# Patient Record
Sex: Female | Born: 1965 | Race: White | Hispanic: No | Marital: Married | Smoking: Current some day smoker
Health system: Southern US, Community
[De-identification: ages and names within clinical notes are randomized; demographics above are authoritative.]

## PROBLEM LIST (undated history)

## (undated) DIAGNOSIS — B009 Herpesviral infection, unspecified: Secondary | ICD-10-CM

## (undated) DIAGNOSIS — F419 Anxiety disorder, unspecified: Secondary | ICD-10-CM

## (undated) DIAGNOSIS — I10 Essential (primary) hypertension: Secondary | ICD-10-CM

## (undated) DIAGNOSIS — T7840XA Allergy, unspecified, initial encounter: Secondary | ICD-10-CM

## (undated) DIAGNOSIS — K219 Gastro-esophageal reflux disease without esophagitis: Secondary | ICD-10-CM

## (undated) DIAGNOSIS — E781 Pure hyperglyceridemia: Secondary | ICD-10-CM

## (undated) HISTORY — PX: BREAST SURGERY: SHX581

## (undated) HISTORY — PX: OTHER SURGICAL HISTORY: SHX169

## (undated) HISTORY — DX: Essential (primary) hypertension: I10

## (undated) HISTORY — PX: BREAST ENHANCEMENT SURGERY: SHX7

## (undated) HISTORY — PX: EYE SURGERY: SHX253

## (undated) HISTORY — DX: Allergy, unspecified, initial encounter: T78.40XA

## (undated) HISTORY — DX: Gastro-esophageal reflux disease without esophagitis: K21.9

## (undated) HISTORY — PX: WISDOM TOOTH EXTRACTION: SHX21

## (undated) HISTORY — DX: Pure hyperglyceridemia: E78.1

## (undated) HISTORY — DX: Herpesviral infection, unspecified: B00.9

## (undated) HISTORY — DX: Anxiety disorder, unspecified: F41.9

---

## 2004-05-14 ENCOUNTER — Emergency Department: Payer: Self-pay | Admitting: Unknown Physician Specialty

## 2015-01-01 ENCOUNTER — Emergency Department: Payer: BLUE CROSS/BLUE SHIELD

## 2015-01-01 ENCOUNTER — Emergency Department
Admission: EM | Admit: 2015-01-01 | Discharge: 2015-01-01 | Disposition: A | Discharge status: Home / Self Care | Payer: BLUE CROSS/BLUE SHIELD | Attending: Emergency Medicine | Admitting: Emergency Medicine

## 2015-01-01 ENCOUNTER — Encounter: Payer: Self-pay | Admitting: Emergency Medicine

## 2015-01-01 DIAGNOSIS — R110 Nausea: Secondary | ICD-10-CM | POA: Diagnosis present

## 2015-01-01 DIAGNOSIS — Z7951 Long term (current) use of inhaled steroids: Secondary | ICD-10-CM | POA: Diagnosis not present

## 2015-01-01 DIAGNOSIS — F17200 Nicotine dependence, unspecified, uncomplicated: Secondary | ICD-10-CM | POA: Insufficient documentation

## 2015-01-01 DIAGNOSIS — Z3202 Encounter for pregnancy test, result negative: Secondary | ICD-10-CM | POA: Diagnosis not present

## 2015-01-01 DIAGNOSIS — R531 Weakness: Secondary | ICD-10-CM | POA: Diagnosis not present

## 2015-01-01 DIAGNOSIS — R000 Tachycardia, unspecified: Secondary | ICD-10-CM

## 2015-01-01 DIAGNOSIS — Z79899 Other long term (current) drug therapy: Secondary | ICD-10-CM | POA: Diagnosis not present

## 2015-01-01 DIAGNOSIS — R002 Palpitations: Secondary | ICD-10-CM | POA: Diagnosis not present

## 2015-01-01 DIAGNOSIS — R55 Syncope and collapse: Secondary | ICD-10-CM | POA: Diagnosis not present

## 2015-01-01 DIAGNOSIS — R0602 Shortness of breath: Secondary | ICD-10-CM | POA: Diagnosis not present

## 2015-01-01 LAB — URINALYSIS COMPLETE WITH MICROSCOPIC (ARMC ONLY)
BILIRUBIN URINE: NEGATIVE
GLUCOSE, UA: NEGATIVE mg/dL
Hgb urine dipstick: NEGATIVE
Leukocytes, UA: NEGATIVE
Nitrite: NEGATIVE
PROTEIN: NEGATIVE mg/dL
Specific Gravity, Urine: 1.008 (ref 1.005–1.030)
pH: 5 (ref 5.0–8.0)

## 2015-01-01 LAB — CBC WITH DIFFERENTIAL/PLATELET
Basophils Absolute: 0 10*3/uL (ref 0–0.1)
Basophils Relative: 0 %
EOS ABS: 0 10*3/uL (ref 0–0.7)
EOS PCT: 0 %
HCT: 41 % (ref 35.0–47.0)
Hemoglobin: 13.4 g/dL (ref 12.0–16.0)
LYMPHS ABS: 1.4 10*3/uL (ref 1.0–3.6)
Lymphocytes Relative: 15 %
MCH: 29.6 pg (ref 26.0–34.0)
MCHC: 32.7 g/dL (ref 32.0–36.0)
MCV: 90.5 fL (ref 80.0–100.0)
MONO ABS: 0.5 10*3/uL (ref 0.2–0.9)
MONOS PCT: 6 %
Neutro Abs: 7.4 10*3/uL — ABNORMAL HIGH (ref 1.4–6.5)
Neutrophils Relative %: 79 %
PLATELETS: 224 10*3/uL (ref 150–440)
RBC: 4.53 MIL/uL (ref 3.80–5.20)
RDW: 13.3 % (ref 11.5–14.5)
WBC: 9.4 10*3/uL (ref 3.6–11.0)

## 2015-01-01 LAB — COMPREHENSIVE METABOLIC PANEL
ALT: 13 U/L — AB (ref 14–54)
AST: 14 U/L — AB (ref 15–41)
Albumin: 4.2 g/dL (ref 3.5–5.0)
Alkaline Phosphatase: 62 U/L (ref 38–126)
Anion gap: 6 (ref 5–15)
BUN: 17 mg/dL (ref 6–20)
CHLORIDE: 107 mmol/L (ref 101–111)
CO2: 23 mmol/L (ref 22–32)
CREATININE: 0.61 mg/dL (ref 0.44–1.00)
Calcium: 9 mg/dL (ref 8.9–10.3)
GFR calc Af Amer: >60 mL/min (ref >60)
GLUCOSE: 102 mg/dL — AB (ref 65–99)
Potassium: 3.6 mmol/L (ref 3.5–5.1)
Sodium: 136 mmol/L (ref 135–145)
Total Bilirubin: 0.6 mg/dL (ref 0.3–1.2)
Total Protein: 7.6 g/dL (ref 6.5–8.1)

## 2015-01-01 LAB — POCT PREGNANCY, URINE: Preg Test, Ur: NEGATIVE

## 2015-01-01 LAB — FIBRIN DERIVATIVES D-DIMER (ARMC ONLY): Fibrin derivatives D-dimer (ARMC): 572 — ABNORMAL HIGH (ref 0–499)

## 2015-01-01 LAB — TROPONIN I

## 2015-01-01 MED ORDER — IOHEXOL 350 MG/ML SOLN
100.0000 mL | Freq: Once | INTRAVENOUS | Status: AC | PRN
  Administered 2015-01-01: 85 mL via INTRAVENOUS

## 2015-01-01 MED ORDER — LORAZEPAM 1 MG PO TABS
1.0000 mg | ORAL_TABLET | Freq: Once | ORAL | Status: AC
  Administered 2015-01-01: 1 mg via ORAL
  Filled 2015-01-01: qty 1

## 2015-01-01 MED ORDER — SODIUM CHLORIDE 0.9 % IV BOLUS (SEPSIS)
1000.0000 mL | Freq: Once | INTRAVENOUS | Status: AC
  Administered 2015-01-01: 1000 mL via INTRAVENOUS

## 2015-01-01 NOTE — ED Notes (Signed)
[place] and having an episode where she felt like her heart rate was high, felt dizzy, nauseous as if she was going to pass out. Pt reports similar episode about 3 days ago; pt took half a xanax and felt relief. Pt reports hx of anxiety.  Pt A/O x 4, vitals WDL, no immediate distress at this time.  Pt husband at bedside.

## 2015-01-01 NOTE — Discharge Instructions (Signed)
It is unclear what led to your near syncopal episode and tachycardia. Your blood tests, EKG, and CT scan all is good. Your heart rate improved with IV fluids. Follow-up at [facility] clinic for ongoing care. Return to the emergency department if you have further episodes of weakness, or fainting, or if you have chest pain, shortness of breath, or other urgent concerns.  Near-Syncope Near-syncope (commonly known as near fainting) is sudden weakness, dizziness, or feeling like you might pass out. This can happen when getting up or while standing for a long time. It is caused by a sudden decrease in blood flow to the brain, which can occur for various reasons. Most of the reasons are not serious.  HOME CARE Watch your condition for any changes.  Have someone stay with you until you feel stable.  If you feel like you are going to pass out:  Lie down right away.  Prop your feet up if you can.  Breathe deeply and steadily.  Move only when the feeling has gone away. Most of the time, this feeling lasts only a few minutes. You may feel tired for several hours.  Drink enough fluids to keep your pee (urine) clear or pale yellow.  If you are taking blood pressure or heart medicine, stand up slowly.  Follow up with your doctor as told. GET HELP RIGHT AWAY IF:   You have a severe headache.  You have unusual pain in the chest, belly (abdomen), or back.  You have bleeding from the mouth or butt (rectum), or you have black or tarry poop (stool).  You feel your heart beat differently than normal, or you have a very fast pulse.  You pass out, or you twitch and shake when you pass out.  You pass out when sitting or lying down.  You feel confused.  You have trouble walking.  You are weak.  You have vision problems. MAKE SURE YOU:   Understand these instructions.  Will watch your condition.  Will get help right away if you are not doing well or get worse.   This information is not  intended to replace advice given to you by your health care provider. Make sure you discuss any questions you have with your health care provider.   Document Released: 06/25/2007 Document Revised: 01/27/2014 Document Reviewed: 06/11/2012 Elsevier Interactive Patient Education [employer].

## 2015-01-01 NOTE — ED Notes (Signed)
Reports an episode pta where her heart started beating fast and felt nauseated and dizzy.

## 2015-01-01 NOTE — ED Provider Notes (Signed)
[facility] Emergency Department Provider Note  ____________________________________________  Time seen: 1600  I have reviewed the triage vital signs and the nursing notes.  History by: Patient  HISTORY  Chief Complaint Nausea  palpitations, anxiety    HPI Colleen Duffy is a 49 y.o. female who was at the post office [place] when she began to feel weak, as though she was went to pass out, and she could feel that her heart rate was up. She felt somewhat nauseous. Patient reports that these feelings were only slightly like when she had had anxiety attacks before. She reports she has not had an anxiety attack in a few years.  The patient denies any chest pain, but she did have some mild shortness of breath. She denies any swelling in her legs. She denies a history of any blood clots, but she does report that it runs in her family. With further discussion, the family history was rather vague, and more consistent with coronary artery disease then DVT or pulmonary emboli.   History reviewed. No pertinent past medical history.  There are no active problems to display for this patient.   Past Surgical History  Procedure Laterality Date  . Breast surgery      Current Outpatient Rx  Name  Route  Sig  Dispense  Refill  . ALPRAZolam (XANAX) 0.5 MG tablet   Oral   Take 0.25-0.5 mg by mouth daily as needed for anxiety.         Marland Kitchen buPROPion (WELLBUTRIN XL) 150 MG 24 hr tablet   Oral   Take 150 mg by mouth daily.         . fluticasone (FLONASE) 50 MCG/ACT nasal spray   Each Nare   Place 1 spray into both nostrils 2 (two) times daily.         . pseudoephedrine (SUDAFED) 30 MG tablet   Oral   Take 30 mg by mouth every 4 (four) hours as needed for congestion (and/or sinus headache).           Allergies Sulfa antibiotics  History reviewed. No pertinent family history.  Social History Social History  Substance Use Topics  . Smoking status:  Current Every Day Smoker  . Smokeless tobacco: None  . Alcohol Use: None    Review of Systems  Constitutional: Negative for fever/chills. ENT: Negative for congestion. Cardiovascular: Other for palpitations and tachycardia. Near-syncope today. See history of present illness. Respiratory: Other for mild shortness of breath today. Gastrointestinal: Negative for abdominal pain, vomiting and diarrhea. Genitourinary: Negative for dysuria. Musculoskeletal: No back pain. Skin: Negative for rash. Neurological: Negative for headache or focal weakness   10-point ROS otherwise negative.  ____________________________________________   PHYSICAL EXAM:  VITAL SIGNS: ED Triage Vitals  Enc Vitals Group     BP 01/01/15 1329 152/83 mmHg     Pulse Rate 01/01/15 1533 103     Resp 01/01/15 1329 18     Temp 01/01/15 1329 98.6 F (37 C)     Temp Source 01/01/15 1329 Oral     SpO2 01/01/15 1533 99 %     Weight 01/01/15 1329 130 lb (58.968 kg)     Height 01/01/15 1329 5' (1.524 m)     Head Cir --      Peak Flow --      Pain Score --      Pain Loc --      Pain Edu? --      Excl. in [place]? --  Constitutional: Alert and oriented. Well appearing and in no distress. ENT   Head: Normocephalic and atraumatic.   Nose: No congestion/rhinnorhea.  Cardiovascular: Tachycardic at a rate of 113, regular rhythm, no murmur noted Respiratory:  Normal respiratory effort, no tachypnea.    Breath sounds are clear and equal bilaterally.  Gastrointestinal: Soft, no distention. Nontender Back: No muscle spasm, no tenderness, no CVA tenderness. Musculoskeletal: No deformity noted. Nontender with normal range of motion in all extremities.  No noted edema. Neurologic:  Communicative. Normal appearing spontaneous movement in all 4 extremities. No gross focal neurologic deficits are appreciated.  Skin:  Skin is warm, dry. No rash noted. Psychiatric: Mood and affect are normal. Speech and behavior are  normal.  ____________________________________________    LABS (pertinent positives/negatives)  Labs Reviewed  COMPREHENSIVE METABOLIC PANEL - Abnormal; Notable for the following:    Glucose, Bld 102 (*)    AST 14 (*)    ALT 13 (*)    All other components within normal limits  CBC WITH DIFFERENTIAL/PLATELET - Abnormal; Notable for the following:    Neutro Abs 7.4 (*)    All other components within normal limits  FIBRIN DERIVATIVES D-DIMER ([facility] ONLY) - Abnormal; Notable for the following:    Fibrin derivatives D-dimer ([facility]) 572 (*)    All other components within normal limits  URINALYSIS COMPLETEWITH MICROSCOPIC ([facility] ONLY) - Abnormal; Notable for the following:    Color, Urine STRAW (*)    APPearance CLEAR (*)    Ketones, ur 1+ (*)    Bacteria, UA RARE (*)    Squamous Epithelial / LPF 0-5 (*)    All other components within normal limits  TROPONIN I  POCT PREGNANCY, URINE     ____________________________________________   EKG  ED ECG REPORT I, Thurlow Gallaga W, the attending physician, personally viewed and interpreted this ECG.   Date: 01/01/2015  EKG Time: 1333  Rate: 102  Rhythm: Sinus tachycardia  Axis: Normal  Intervals: Normal  ST&T Change: Flat T in 3 with small Q   ____________________________________________    RADIOLOGY  CT angiogram chest:  IMPRESSION: No evidence of significant pulmonary embolus. No evidence of active pulmonary disease.   ____________________________________________   PROCEDURES    ____________________________________________   INITIAL IMPRESSION / ASSESSMENT AND PLAN / ED COURSE  Pertinent labs & imaging results that were available during my care of the patient were reviewed by me and considered in my medical decision making (see chart for details).  Pleasant, overall well-appearing 49 year old female. She does appear to be a little bit anxious, but overall appropriate and communicative. She is tachycardic at  105-125, with notable increase during parts of our conversation that may be more stress inducing.  My overall impression is that this is not an anxiety attack, but rather an underlying organic problem. It is vague and unclear what the underlying condition is.  The patient could use some fluids due to the tachycardia, however she would prefer not to have an IV. She is not having any nausea currently. She is currently drinking a bottle of water with my approval.  Her blood test overall looked good. We have discussed the possibility of a pulmonary emboli. Given her clinical symptoms, I overall have a low suspicion. She has no unilateral swelling, no significant shortness of breath, no significant chest pain or pleuritic pain. She has a vague family history of blood clots. We will check a d-dimer.  We will also check urinalysis.   ----------------------------------------- 6:40 PM on 01/01/2015 -----------------------------------------  Patient's d-dimer was positive at 572. I discussed this with the patient we've agreed to get a CT scan to rule out pulmonary emboli due to her unexplained tachycardia.  Patient does appear to be more calm now status post oral Ativan. Her heart rate is lower than it was, though still goes up just above 100 during our conversation.  ----------------------------------------- 7:44 PM on 01/01/2015 -----------------------------------------  CT angiogram is negative for pulmonary emboli. Results of urinalysis and reviewed. They are negative without sign of urinary tract infection.  At this time, the patient's heart rate is 102. She looks well and is communicative. It is unclear to me what has triggered the tachycardia. We have discussed her differential diagnosis at length. I've asked her to follow-up with [facility] clinic. I've also counseled her to return the emergency department if she has any further symptoms, weakness, or other urgent concerns.    ____________________________________________   FINAL CLINICAL IMPRESSION(S) / ED DIAGNOSES  Final diagnoses:  Near syncope  Sinus tachycardia ([place])       Ahmed Prima, MD 01/01/15 2013

## 2015-01-01 NOTE — ED Notes (Signed)
POCT Urine Preg negative.

## 2015-01-01 NOTE — ED Notes (Signed)
Patient transported to CT 

## 2017-04-16 IMAGING — CT CT ANGIO CHEST
2 of 6 series · 19 of 46 positions shown · IV contrast (APPLIED)
Comparison: None.

CLINICAL DATA: Tachycardia, near syncope, positive D-dimer. Patient
fell today episode of dizziness, nausea, and felt like she was going
to pass out. Similar episode 3 days ago.

EXAM:
CT ANGIOGRAPHY CHEST WITH CONTRAST
TECHNIQUE: Multidetector CT imaging of the chest was performed using the
standard protocol during bolus administration of intravenous
contrast. Multiplanar CT image reconstructions and MIPs were
obtained to evaluate the vascular anatomy.
CONTRAST:  85mL OMNIPAQUE IOHEXOL 350 MG/ML SOLN

[Series 5: pe thins 1.5 · axial · 0.68mm/px · z∈[-619,-384]mm · 16 of 220 slices shown]
[im 12/220  lung]
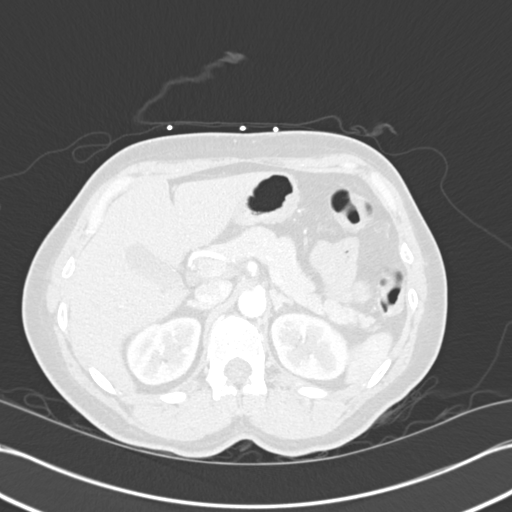
[im 24/220  soft-tissue]
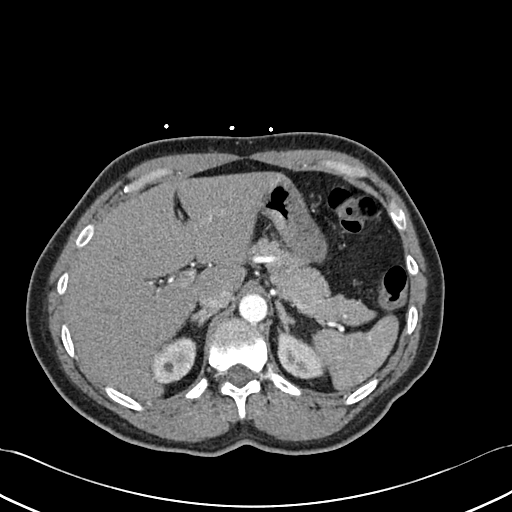
[im 35/220  lung]
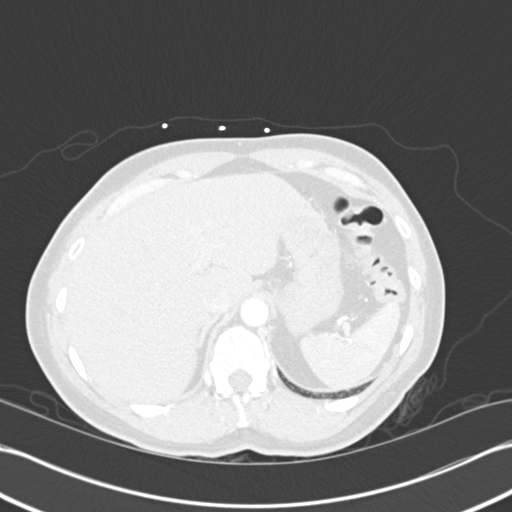
[im 47/220  soft-tissue]
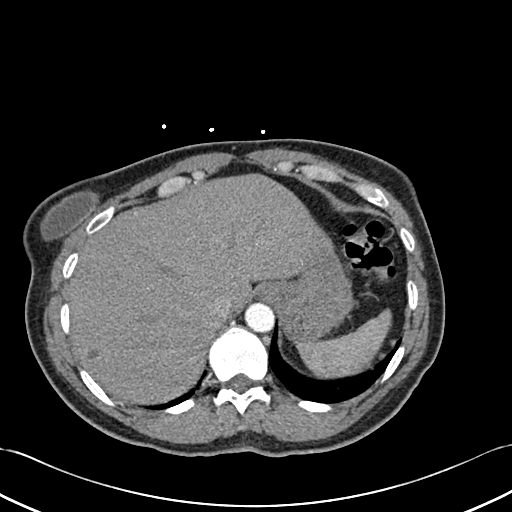
[im 70/220  lung]
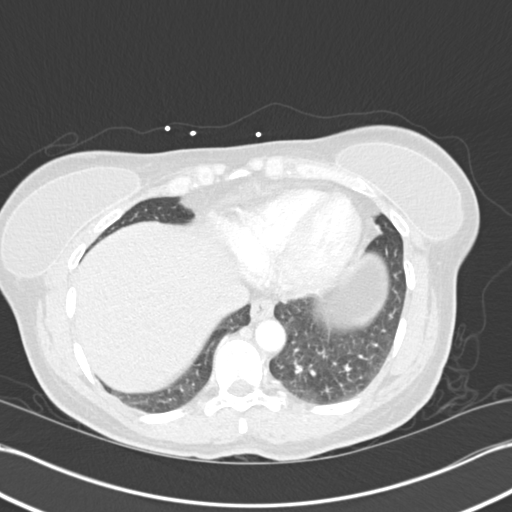
[im 81/220  soft-tissue]
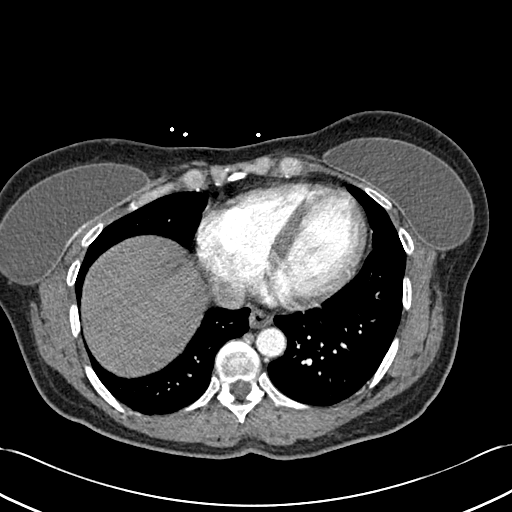
[im 93/220  lung]
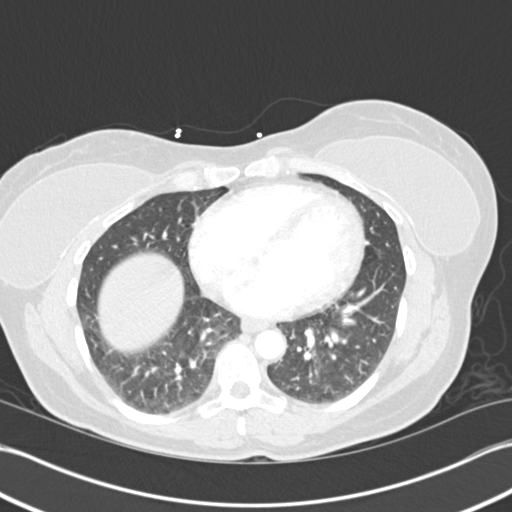
[im 104/220  soft-tissue]
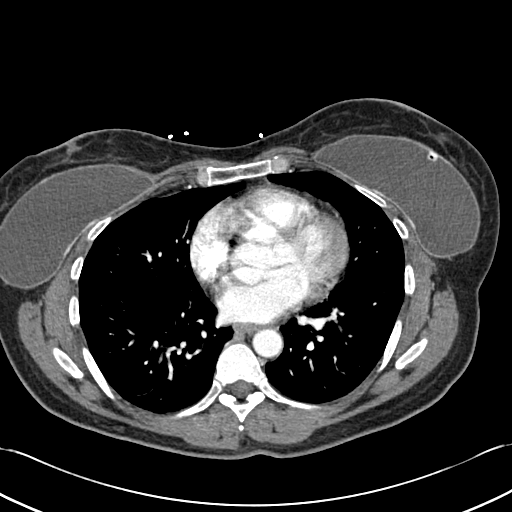
[im 116/220  lung]
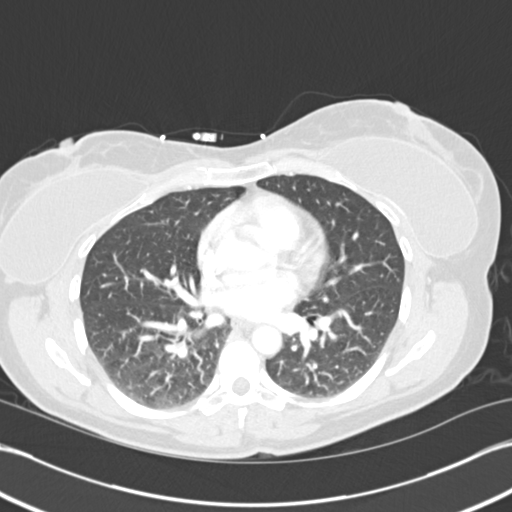
[im 127/220  soft-tissue]
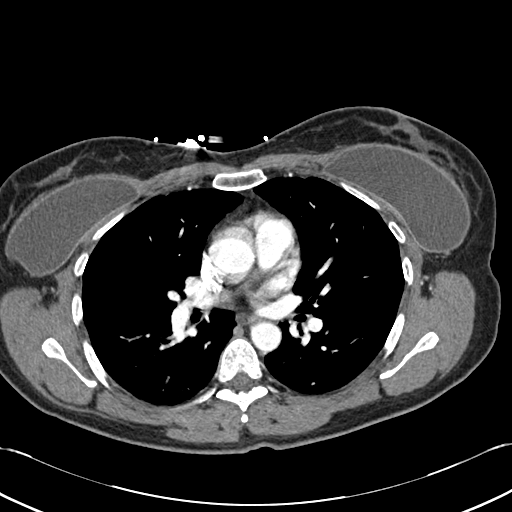
[im 139/220  lung]
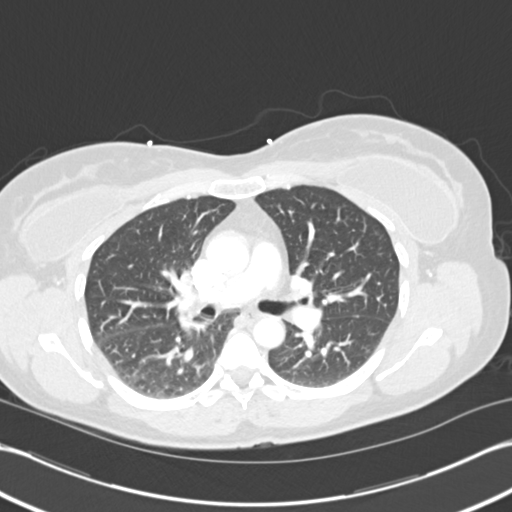
[im 150/220  soft-tissue]
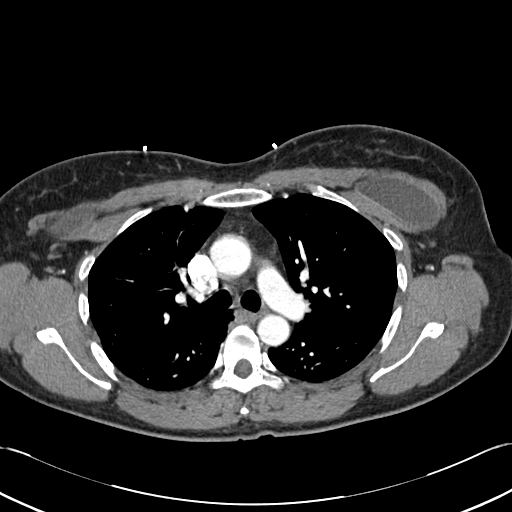
[im 173/220  lung]
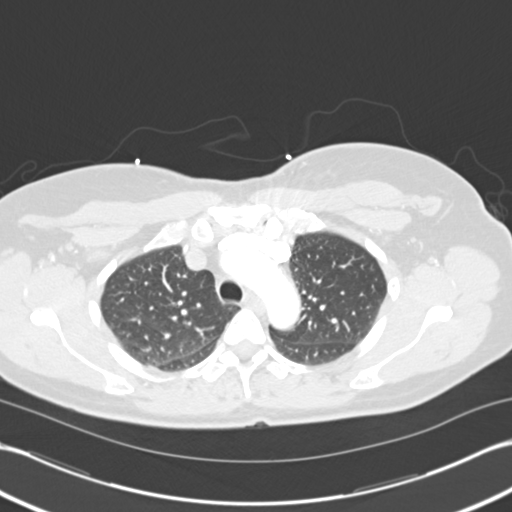
[im 185/220  soft-tissue]
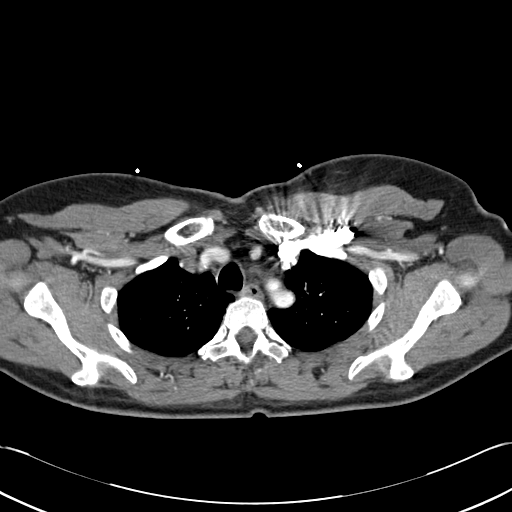
[im 196/220  lung]
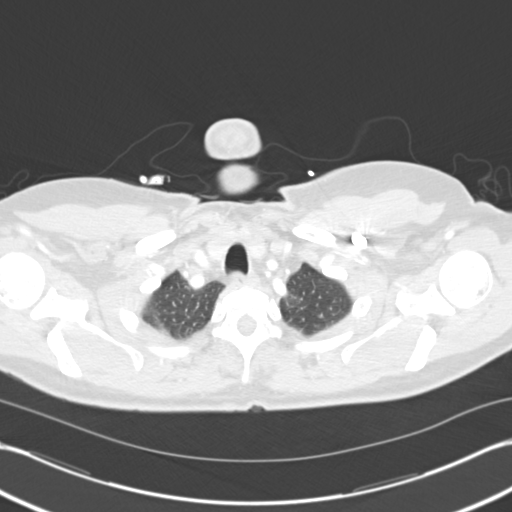
[im 208/220  soft-tissue]
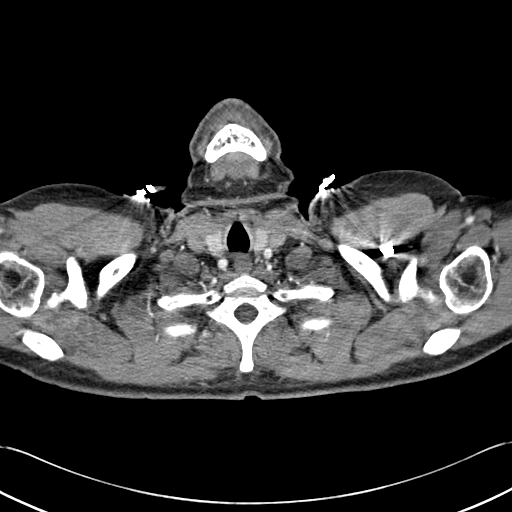

[Series 7: cor mpr 2.0 · coronal · 0.50mm/px · 3 of 109 slices shown]
[im 28/109  soft-tissue]
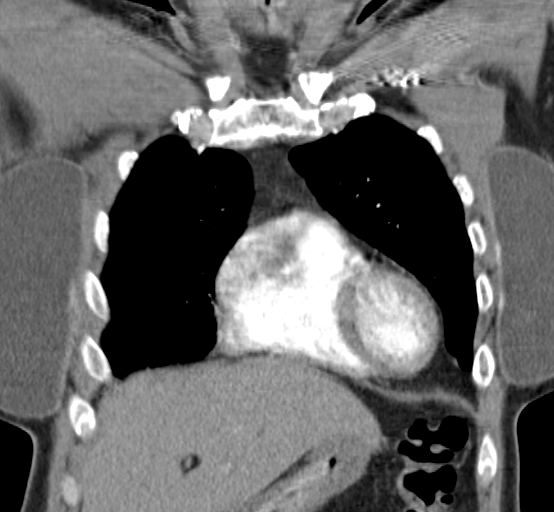
[im 55/109  soft-tissue]
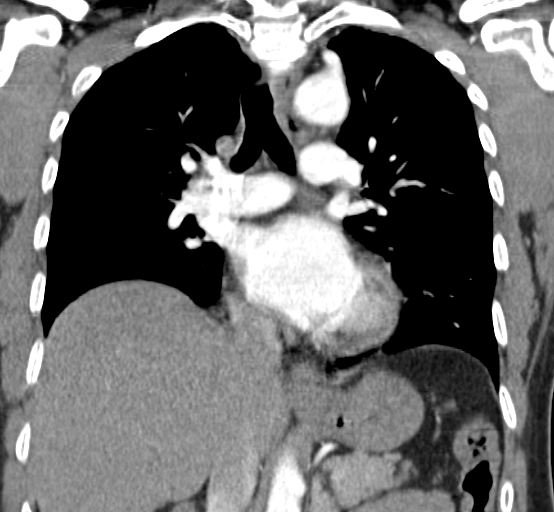
[im 82/109  soft-tissue]
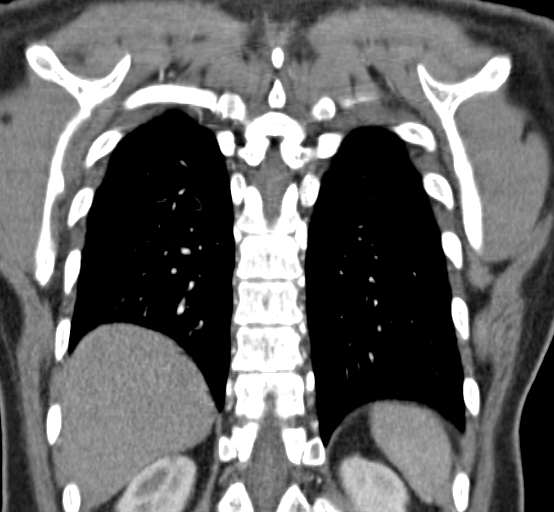

[19 of 46 positions shown; findings below may reference images not displayed]

FINDINGS: Technically adequate study with good opacification of the central
and segmental pulmonary arteries. No focal filling defects
identified. No evidence of significant pulmonary embolus.

Normal heart size. Normal caliber thoracic aorta. No evidence of
aortic dissection. Great vessel origins are patent. Esophagus is
mostly decompressed. No significant lymphadenopathy in the chest.

Evaluation of the lungs is limited due to respiratory motion
artifact. There is probable dependent atelectasis in the lung bases.
No focal consolidation or airspace disease is suggested. Airways are
patent. No pleural effusions. No pneumothorax.

Included portions of the upper abdominal organs demonstrate a sub cm
low-attenuation lesion in the right lobe of the liver. This is too
small to characterize but probably represents a small cyst or
hemangioma. Subcentimeter cysts are suggested in the upper poles of
both kidneys. Bilateral breast implants. Degenerative changes in the
spine. No destructive bone lesions appreciated.

Review of the MIP images confirms the above findings.
IMPRESSION: No evidence of significant pulmonary embolus. No evidence of active
pulmonary disease.

## 2018-03-18 ENCOUNTER — Other Ambulatory Visit: Payer: Self-pay

## 2018-03-18 DIAGNOSIS — I83891 Varicose veins of right lower extremities with other complications: Secondary | ICD-10-CM

## 2018-03-23 ENCOUNTER — Inpatient Hospital Stay (HOSPITAL_COMMUNITY): Admission: RE | Admit: 2018-03-23 | Payer: BLUE CROSS/BLUE SHIELD | Source: Ambulatory Visit

## 2018-03-23 ENCOUNTER — Encounter: Payer: Self-pay | Admitting: Family

## 2018-03-23 ENCOUNTER — Encounter: Payer: BLUE CROSS/BLUE SHIELD | Admitting: Vascular Surgery

## 2019-09-22 DIAGNOSIS — Z01419 Encounter for gynecological examination (general) (routine) without abnormal findings: Secondary | ICD-10-CM | POA: Diagnosis not present

## 2019-09-22 DIAGNOSIS — B373 Candidiasis of vulva and vagina: Secondary | ICD-10-CM | POA: Diagnosis not present

## 2019-09-22 DIAGNOSIS — Z13820 Encounter for screening for osteoporosis: Secondary | ICD-10-CM | POA: Diagnosis not present

## 2019-09-22 DIAGNOSIS — Z6828 Body mass index (BMI) 28.0-28.9, adult: Secondary | ICD-10-CM | POA: Diagnosis not present

## 2019-11-15 DIAGNOSIS — R5382 Chronic fatigue, unspecified: Secondary | ICD-10-CM | POA: Diagnosis not present

## 2019-11-15 DIAGNOSIS — N951 Menopausal and female climacteric states: Secondary | ICD-10-CM | POA: Diagnosis not present

## 2019-11-17 DIAGNOSIS — R6882 Decreased libido: Secondary | ICD-10-CM | POA: Diagnosis not present

## 2019-11-17 DIAGNOSIS — N951 Menopausal and female climacteric states: Secondary | ICD-10-CM | POA: Diagnosis not present

## 2019-11-17 DIAGNOSIS — Z6827 Body mass index (BMI) 27.0-27.9, adult: Secondary | ICD-10-CM | POA: Diagnosis not present

## 2019-12-20 DIAGNOSIS — J0110 Acute frontal sinusitis, unspecified: Secondary | ICD-10-CM | POA: Diagnosis not present

## 2019-12-20 DIAGNOSIS — I10 Essential (primary) hypertension: Secondary | ICD-10-CM | POA: Diagnosis not present

## 2020-02-08 DIAGNOSIS — R053 Chronic cough: Secondary | ICD-10-CM | POA: Diagnosis not present

## 2020-02-08 DIAGNOSIS — J0100 Acute maxillary sinusitis, unspecified: Secondary | ICD-10-CM | POA: Diagnosis not present

## 2020-02-14 DIAGNOSIS — N951 Menopausal and female climacteric states: Secondary | ICD-10-CM | POA: Diagnosis not present

## 2020-02-14 DIAGNOSIS — R5382 Chronic fatigue, unspecified: Secondary | ICD-10-CM | POA: Diagnosis not present

## 2020-02-16 DIAGNOSIS — R232 Flushing: Secondary | ICD-10-CM | POA: Diagnosis not present

## 2020-02-16 DIAGNOSIS — N951 Menopausal and female climacteric states: Secondary | ICD-10-CM | POA: Diagnosis not present

## 2020-02-16 DIAGNOSIS — N898 Other specified noninflammatory disorders of vagina: Secondary | ICD-10-CM | POA: Diagnosis not present

## 2020-04-10 DIAGNOSIS — Z0000 Encounter for general adult medical examination without abnormal findings: Secondary | ICD-10-CM | POA: Diagnosis not present

## 2020-04-10 DIAGNOSIS — Z13220 Encounter for screening for lipoid disorders: Secondary | ICD-10-CM | POA: Diagnosis not present

## 2020-04-10 DIAGNOSIS — Z131 Encounter for screening for diabetes mellitus: Secondary | ICD-10-CM | POA: Diagnosis not present

## 2020-04-17 DIAGNOSIS — Z1211 Encounter for screening for malignant neoplasm of colon: Secondary | ICD-10-CM | POA: Diagnosis not present

## 2020-04-17 DIAGNOSIS — E781 Pure hyperglyceridemia: Secondary | ICD-10-CM | POA: Diagnosis not present

## 2020-04-17 DIAGNOSIS — Z0000 Encounter for general adult medical examination without abnormal findings: Secondary | ICD-10-CM | POA: Diagnosis not present

## 2020-05-14 DIAGNOSIS — R5382 Chronic fatigue, unspecified: Secondary | ICD-10-CM | POA: Diagnosis not present

## 2020-05-14 DIAGNOSIS — N951 Menopausal and female climacteric states: Secondary | ICD-10-CM | POA: Diagnosis not present

## 2020-05-16 DIAGNOSIS — R6882 Decreased libido: Secondary | ICD-10-CM | POA: Diagnosis not present

## 2020-05-16 DIAGNOSIS — R232 Flushing: Secondary | ICD-10-CM | POA: Diagnosis not present

## 2020-05-16 DIAGNOSIS — N951 Menopausal and female climacteric states: Secondary | ICD-10-CM | POA: Diagnosis not present

## 2020-08-13 DIAGNOSIS — R5382 Chronic fatigue, unspecified: Secondary | ICD-10-CM | POA: Diagnosis not present

## 2020-08-13 DIAGNOSIS — N951 Menopausal and female climacteric states: Secondary | ICD-10-CM | POA: Diagnosis not present

## 2020-08-15 DIAGNOSIS — R6882 Decreased libido: Secondary | ICD-10-CM | POA: Diagnosis not present

## 2020-08-15 DIAGNOSIS — L814 Other melanin hyperpigmentation: Secondary | ICD-10-CM | POA: Diagnosis not present

## 2020-08-15 DIAGNOSIS — D2362 Other benign neoplasm of skin of left upper limb, including shoulder: Secondary | ICD-10-CM | POA: Diagnosis not present

## 2020-08-15 DIAGNOSIS — D485 Neoplasm of uncertain behavior of skin: Secondary | ICD-10-CM | POA: Diagnosis not present

## 2020-08-15 DIAGNOSIS — N951 Menopausal and female climacteric states: Secondary | ICD-10-CM | POA: Diagnosis not present

## 2020-08-15 DIAGNOSIS — R232 Flushing: Secondary | ICD-10-CM | POA: Diagnosis not present

## 2020-08-15 DIAGNOSIS — L821 Other seborrheic keratosis: Secondary | ICD-10-CM | POA: Diagnosis not present

## 2020-08-15 DIAGNOSIS — L819 Disorder of pigmentation, unspecified: Secondary | ICD-10-CM | POA: Diagnosis not present

## 2020-08-15 DIAGNOSIS — D2372 Other benign neoplasm of skin of left lower limb, including hip: Secondary | ICD-10-CM | POA: Diagnosis not present

## 2020-08-15 DIAGNOSIS — N898 Other specified noninflammatory disorders of vagina: Secondary | ICD-10-CM | POA: Diagnosis not present

## 2020-08-23 ENCOUNTER — Encounter: Payer: Self-pay | Admitting: Gastroenterology

## 2020-08-23 ENCOUNTER — Ambulatory Visit: Payer: BC Managed Care – PPO | Admitting: Gastroenterology

## 2020-08-23 ENCOUNTER — Other Ambulatory Visit (INDEPENDENT_AMBULATORY_CARE_PROVIDER_SITE_OTHER): Payer: BC Managed Care – PPO

## 2020-08-23 VITALS — BP 111/70 | HR 86 | Ht 60.0 in | Wt 162.0 lb

## 2020-08-23 DIAGNOSIS — R1010 Upper abdominal pain, unspecified: Secondary | ICD-10-CM

## 2020-08-23 DIAGNOSIS — R140 Abdominal distension (gaseous): Secondary | ICD-10-CM

## 2020-08-23 DIAGNOSIS — K5900 Constipation, unspecified: Secondary | ICD-10-CM

## 2020-08-23 DIAGNOSIS — Z8371 Family history of colonic polyps: Secondary | ICD-10-CM

## 2020-08-23 LAB — LIPASE: Lipase: 62 U/L — ABNORMAL HIGH (ref 11.0–59.0)

## 2020-08-23 MED ORDER — SUCRALFATE 1 G PO TABS: 1.0000 g | ORAL_TABLET | Freq: Three times a day (TID) | ORAL | 2 refills | Status: DC

## 2020-08-23 NOTE — Patient Instructions (Signed)
Your provider has requested that you go to the basement level for lab work before leaving today. Press "B" on the elevator. The lab is located at the first door on the left as you exit the elevator.  We have sent the following medications to your pharmacy for you to pick up at your convenience: carafate.   You have been scheduled for an endoscopy and colonoscopy. Please follow the written instructions given to you at your visit today. Please pick up your prep supplies at the pharmacy within the next 1-3 days. If you use inhalers (even only as needed), please bring them with you on the day of your procedure.  You will be contacted by [facility] in the next 2 days to arrange a complete abdominal ultrasound.  The number on your caller ID will be [phone], please answer when they call.  If you have not heard from them in 2 days please call [phone] to schedule.     Normal BMI (Body Mass Index- based on height and weight) is between 19 and 25. Your BMI today is Body mass index is 31.64 kg/m. Marland Kitchen Please consider follow up  regarding your BMI with your Primary Care Provider.  The  GI providers would like to encourage you to use [facility] to communicate with providers for non-urgent requests or questions.  Due to long hold times on the telephone, sending your provider a message by [facility] may be a faster and more efficient way to get a response.  Please allow 48 business hours for a response.  Please remember that this is for non-urgent requests.   Due to recent changes in healthcare laws, you may see the results of your imaging and laboratory studies on MyChart before your provider has had a chance to review them.  We understand that in some cases there may be results that are confusing or concerning to you. Not all laboratory results come back in the same time frame and the provider may be waiting for multiple results in order to interpret others.  Please give [place] 48 hours in  order for your provider to thoroughly review all the results before contacting the office for clarification of your results.   Thank you for choosing me and [facility].  Pricilla Riffle. Dagoberto Ligas., MD., Marval Regal

## 2020-08-23 NOTE — Progress Notes (Signed)
History of Present Illness: This is a 55 year old female self referred for the evaluation of abdominal bloating, upper abdominal pain, constipation.  She relates a 57-monthhistory of frequent abdominal bloating and upper abdominal pain.  Abdominal pain has improved with the use of over-the-counter omeprazole and Carafate.  Her abdominal bloating has not changed.  She has ongoing constipation and takes magnesium tablets at bedtime with good results in controlling constipation.  She has had a 30 pound weight gain. Her father has a history of colon polyps. No prior colonoscopy or EGD. Denies weight loss, diarrhea, change in stool caliber, melena, hematochezia, nausea, vomiting, dysphagia, reflux symptoms, chest pain.   CMP, CBC, TSH in March were normal.  Cholesterol and triglycerides in March were elevated.   Allergies  Allergen Reactions   Sulfa Antibiotics Rash   Outpatient Medications Prior to Visit  Medication Sig Dispense Refill   ALPRAZolam (XANAX) 0.5 MG tablet Take 0.25-0.5 mg by mouth daily as needed for anxiety.     buPROPion (WELLBUTRIN XL) 150 MG 24 hr tablet Take 150 mg by mouth daily.     fluticasone (FLONASE) 50 MCG/ACT nasal spray Place 1 spray into both nostrils 2 (two) times daily.     pseudoephedrine (SUDAFED) 30 MG tablet Take 30 mg by mouth every 4 (four) hours as needed for congestion (and/or sinus headache).     No facility-administered medications prior to visit.   Past Medical History:  Diagnosis Date   Anxiety    Herpes    Hypertension    Hypertriglyceridemia    Past Surgical History:  Procedure Laterality Date   BREAST ENHANCEMENT SURGERY     BREAST SURGERY     WISDOM TOOTH EXTRACTION     Social History   Socioeconomic History   Marital status: Married    Spouse name: Not on file   Number of children: Not on file   Years of education: Not on file   Highest education level: Not on file  Occupational History   Not on file  Tobacco Use   Smoking  status: Former    Types: Cigarettes   Smokeless tobacco: Never  Substance and Sexual Activity   Alcohol use: Yes   Drug use: Never   Sexual activity: Not on file  Other Topics Concern   Not on file  Social History Narrative   Not on file   Social Determinants of Health   Financial Resource Strain: Not on file  Food Insecurity: Not on file  Transportation Needs: Not on file  Physical Activity: Not on file  Stress: Not on file  Social Connections: Not on file   Family History  Problem Relation Age of Onset   Parkinson's disease Mother    Stroke Father        Review of Systems: Pertinent positive and negative review of systems were noted in the above HPI section. All other review of systems were otherwise negative.    Physical Exam: General: Well developed, well nourished, no acute distress Head: Normocephalic and atraumatic Eyes: Sclerae anicteric, EOMI Ears: Normal auditory acuity Mouth: Not examined, mask on during Covid-19 pandemic Neck: Supple, no masses or thyromegaly Lungs: Clear throughout to auscultation Heart: Regular rate and rhythm; no murmurs, rubs or bruits Abdomen: Soft, non tender and non distended. No masses, hepatosplenomegaly or hernias noted. Normal Bowel sounds Rectal: Deferred to colonoscopy Musculoskeletal: Symmetrical with no gross deformities  Skin: No lesions on visible extremities Pulses:  Normal pulses noted Extremities: No clubbing,  cyanosis, edema or deformities noted Neurological: Alert oriented x 4, grossly nonfocal Cervical Nodes:  No significant cervical adenopathy Inguinal Nodes: No significant inguinal adenopathy Psychological:  Alert and cooperative. Normal mood and affect   Assessment and Recommendations:  Upper abdominal pain, abdominal bloating, constipation, weight gain.  Rule out ulcer, gastritis, cholelithiasis.  Recommended omeprazole 40 mg daily however she prefers Carafate 1 g 3 times daily.  Obtain lipase today.   Schedule abdominal US.  Schedule EGD. The risks (including bleeding, perforation, infection, missed lesions, medication reactions and possible hospitalization or surgery if complications occur), benefits, and alternatives to endoscopy with possible biopsy and possible dilation were discussed with the patient and they consent to proceed.   Constipation.  Continue magnesium tablets at bedtime.  Increase daily water intake.  Increase dietary fiber intake. Family history of colon polyps, first-degree relative.  Schedule colonoscopy. The risks (including bleeding, perforation, infection, missed lesions, medication reactions and possible hospitalization or surgery if complications occur), benefits, and alternatives to colonoscopy with possible biopsy and possible polypectomy were discussed with the patient and they consent to proceed.   Hyperlipidemia.  Advised her to return to her PCP for further management.  She states she is in the process of obtaining a new PCP.

## 2020-08-31 ENCOUNTER — Other Ambulatory Visit: Payer: Self-pay

## 2020-08-31 ENCOUNTER — Ambulatory Visit (HOSPITAL_COMMUNITY)
Admission: RE | Admit: 2020-08-31 | Discharge: 2020-08-31 | Disposition: A | Discharge status: Home / Self Care | Payer: BC Managed Care – PPO | Source: Ambulatory Visit | Attending: Gastroenterology | Admitting: Gastroenterology

## 2020-08-31 DIAGNOSIS — R1010 Upper abdominal pain, unspecified: Secondary | ICD-10-CM | POA: Diagnosis not present

## 2020-08-31 DIAGNOSIS — R140 Abdominal distension (gaseous): Secondary | ICD-10-CM | POA: Diagnosis not present

## 2020-08-31 DIAGNOSIS — K7689 Other specified diseases of liver: Secondary | ICD-10-CM | POA: Diagnosis not present

## 2020-09-04 ENCOUNTER — Telehealth: Payer: Self-pay

## 2020-09-27 ENCOUNTER — Encounter: Payer: Self-pay | Admitting: Gastroenterology

## 2020-10-02 ENCOUNTER — Other Ambulatory Visit: Payer: Self-pay | Admitting: Gastroenterology

## 2020-10-02 ENCOUNTER — Telehealth: Payer: Self-pay | Admitting: Gastroenterology

## 2020-10-02 MED ORDER — NA SULFATE-K SULFATE-MG SULF 17.5-3.13-1.6 GM/177ML PO SOLN: 1.0000 | Freq: Once | ORAL | 0 refills | Status: AC

## 2020-10-02 NOTE — Telephone Encounter (Signed)
Prescription sent to patient's pharmacy.

## 2020-10-02 NOTE — Telephone Encounter (Signed)
Inbound call from patient. She is scheduled for endo/colon 9/15. States her pharmacy does not have her prep for the procedure. Would like sent to [facility] in [place] on [address].

## 2020-10-04 ENCOUNTER — Encounter: Payer: Self-pay | Admitting: Gastroenterology

## 2020-10-04 ENCOUNTER — Other Ambulatory Visit: Payer: Self-pay

## 2020-10-04 ENCOUNTER — Ambulatory Visit (AMBULATORY_SURGERY_CENTER): Payer: BC Managed Care – PPO | Admitting: Gastroenterology

## 2020-10-04 VITALS — BP 129/85 | HR 19 | Temp 98.9°F | Resp 17 | Ht 60.0 in | Wt 162.0 lb

## 2020-10-04 DIAGNOSIS — K319 Disease of stomach and duodenum, unspecified: Secondary | ICD-10-CM

## 2020-10-04 DIAGNOSIS — R1010 Upper abdominal pain, unspecified: Secondary | ICD-10-CM

## 2020-10-04 DIAGNOSIS — Z86010 Personal history of colonic polyps: Secondary | ICD-10-CM | POA: Diagnosis not present

## 2020-10-04 DIAGNOSIS — Z8371 Family history of colonic polyps: Secondary | ICD-10-CM

## 2020-10-04 DIAGNOSIS — Z1211 Encounter for screening for malignant neoplasm of colon: Secondary | ICD-10-CM

## 2020-10-04 DIAGNOSIS — D123 Benign neoplasm of transverse colon: Secondary | ICD-10-CM | POA: Diagnosis not present

## 2020-10-04 MED ORDER — FAMOTIDINE 40 MG PO TABS: 40.0000 mg | ORAL_TABLET | Freq: Every day | ORAL | 11 refills | Status: DC

## 2020-10-04 MED ORDER — SODIUM CHLORIDE 0.9 % IV SOLN: 500.0000 mL | Freq: Once | INTRAVENOUS | Status: DC

## 2020-10-04 NOTE — Progress Notes (Signed)
Called to room to assist during endoscopic procedure.  Patient ID and intended procedure confirmed with present staff. Received instructions for my participation in the procedure from the performing physician.  

## 2020-10-04 NOTE — Op Note (Signed)
[place] Patient Name: Colleen Duffy Procedure Date: 10/04/2020 8:02 AM MRN: [id number] Endoscopist: Ladene Artist , MD Age: 55 Referring MD:  Date of Birth: 08-01-1965 Gender: Female Account #: [id number] Procedure:                Colonoscopy Indications:              Colon cancer screening in patient at increased                            risk: Family history of 1st-degree relative with                            colon polyps Medicines:                Monitored Anesthesia Care Procedure:                Pre-Anesthesia Assessment:                           - Prior to the procedure, a History and Physical                            was performed, and patient medications and                            allergies were reviewed. The patient's tolerance of                            previous anesthesia was also reviewed. The risks                            and benefits of the procedure and the sedation                            options and risks were discussed with the patient.                            All questions were answered, and informed consent                            was obtained. Prior Anticoagulants: The patient has                            taken no previous anticoagulant or antiplatelet                            agents. ASA Grade Assessment: II - A patient with                            mild systemic disease. After reviewing the risks                            and benefits, the patient was deemed in  satisfactory condition to undergo the procedure.                           After obtaining informed consent, the colonoscope                            was passed under direct vision. Throughout the                            procedure, the patient's blood pressure, pulse, and                            oxygen saturations were monitored continuously. The                            Olympus [id number] ([id number]) Colonoscope was                             introduced through the anus and advanced to the the                            cecum, identified by appendiceal orifice and                            ileocecal valve. The ileocecal valve, appendiceal                            orifice, and rectum were photographed. The quality                            of the bowel preparation was good. The colonoscopy                            was performed without difficulty. The patient                            tolerated the procedure well. Scope In: 8:10:29 AM Scope Out: 8:27:03 AM Scope Withdrawal Time: 0 hours 12 minutes 52 seconds  Total Procedure Duration: 0 hours 16 minutes 34 seconds  Findings:                 The perianal and digital rectal examinations were                            normal.                           A 8 mm polyp was found in the transverse colon. The                            polyp was sessile. The polyp was removed with a                            cold snare. Resection and retrieval were complete.  A few medium-mouthed diverticula were found in the                            left colon. There was no evidence of diverticular                            bleeding.                           Internal hemorrhoids were found during                            retroflexion. The hemorrhoids were small and Grade                            I (internal hemorrhoids that do not prolapse).                           The exam was otherwise without abnormality on                            direct and retroflexion views. Complications:            No immediate complications. Estimated blood loss:                            None. Estimated Blood Loss:     Estimated blood loss: none. Impression:               - One 8 mm polyp in the transverse colon, removed                            with a cold snare. Resected and retrieved.                           - Mild diverticulosis in the left colon.                            - Internal hemorrhoids.                           - The examination was otherwise normal on direct                            and retroflexion views. Recommendation:           - Repeat colonoscopy date to be determined after                            pending pathology results are reviewed for                            surveillance based on pathology results.                           - Patient has a contact number available for  emergencies. The signs and symptoms of potential                            delayed complications were discussed with the                            patient. Return to normal activities tomorrow.                            Written discharge instructions were provided to the                            patient.                           - Hihg fiber diet.                           - Continue present medications.                           - Await pathology results. Ladene Artist, MD 10/04/2020 8:39:06 AM This report has been signed electronically.

## 2020-10-04 NOTE — Progress Notes (Signed)
History & Physical  Primary Care Physician:  Pcp, No Primary Gastroenterologist: M. Fuller Plan, MD  CHIEF COMPLAINT:  Family history of colon polyps, upper abdominal pain.   HPI: Colleen Duffy is a 55 y.o. female who presents for colonoscopy and EGD to evaluate her CC. See 08/21/2020 office H&P for more information.     Past Medical History:  Diagnosis Date   Allergy    Anxiety    GERD (gastroesophageal reflux disease)    Herpes    Hypertension    Hypertriglyceridemia     Past Surgical History:  Procedure Laterality Date   BREAST ENHANCEMENT SURGERY     BREAST SURGERY     cosmetic eye surgery     EYE SURGERY     WISDOM TOOTH EXTRACTION      Prior to Admission medications   Medication Sig Start Date End Date Taking? Authorizing Provider  ALPRAZolam Duanne Moron) 0.5 MG tablet Take 0.25-0.5 mg by mouth daily as needed for anxiety.   Yes [provider]  amLODipine (NORVASC) 5 MG tablet Take 5 mg by mouth daily. 08/17/20  Yes [provider]  levocetirizine (XYZAL) 5 MG tablet Take by mouth. 02/16/15  Yes [provider]  metoprolol succinate (TOPROL-XL) 50 MG 24 hr tablet Take 100 mg by mouth daily. 08/07/20  Yes [provider]  sucralfate (CARAFATE) 1 g tablet Take 1 tablet (1 g total) by mouth in the morning, at noon, and at bedtime. 08/23/20  Yes Ladene Artist, MD  venlafaxine XR (EFFEXOR-XR) 150 MG 24 hr capsule Take by mouth. 06/04/20  Yes [provider]  venlafaxine XR (EFFEXOR-XR) 75 MG 24 hr capsule Take 75 mg by mouth daily with breakfast.   Yes [provider]  fluticasone (FLONASE) 50 MCG/ACT nasal spray Place 1 spray into both nostrils 2 (two) times daily.    [provider]  valACYclovir (VALTREX) 500 MG tablet Take by mouth. 07/05/20   [provider]    Current Outpatient Medications  Medication Sig Dispense Refill   ALPRAZolam (XANAX) 0.5 MG tablet Take 0.25-0.5 mg by mouth daily as needed for  anxiety.     amLODipine (NORVASC) 5 MG tablet Take 5 mg by mouth daily.     levocetirizine (XYZAL) 5 MG tablet Take by mouth.     metoprolol succinate (TOPROL-XL) 50 MG 24 hr tablet Take 100 mg by mouth daily.     sucralfate (CARAFATE) 1 g tablet Take 1 tablet (1 g total) by mouth in the morning, at noon, and at bedtime. 90 tablet 2   venlafaxine XR (EFFEXOR-XR) 150 MG 24 hr capsule Take by mouth.     venlafaxine XR (EFFEXOR-XR) 75 MG 24 hr capsule Take 75 mg by mouth daily with breakfast.     fluticasone (FLONASE) 50 MCG/ACT nasal spray Place 1 spray into both nostrils 2 (two) times daily.     valACYclovir (VALTREX) 500 MG tablet Take by mouth.     Current Facility-Administered Medications  Medication Dose Route Frequency Provider Last Rate Last Admin   0.9 %  sodium chloride infusion  500 mL Intravenous Once Ladene Artist, MD        Allergies as of 10/04/2020 - Review Complete 10/04/2020  Allergen Reaction Noted   Sulfa antibiotics Rash 01/01/2015    Family History  Problem Relation Age of Onset   Parkinson's disease Mother    Colon polyps Father    Stroke Father    Colon cancer Neg Hx  Esophageal cancer Neg Hx    Stomach cancer Neg Hx     Social History   Socioeconomic History   Marital status: Married    Spouse name: Not on file   Number of children: Not on file   Years of education: Not on file   Highest education level: Not on file  Occupational History   Not on file  Tobacco Use   Smoking status: Some Days    Types: Cigarettes   Smokeless tobacco: Never   Tobacco comments:    Cigarettes while drinking alcohol  Substance and Sexual Activity   Alcohol use: Not Currently    Comment: 8 drinks per week   Drug use: Never   Sexual activity: Not on file  Other Topics Concern   Not on file  Social History Narrative   Not on file   Social Determinants of Health   Financial Resource Strain: Not on file  Food Insecurity: Not on file  Transportation Needs:  Not on file  Physical Activity: Not on file  Stress: Not on file  Social Connections: Not on file  Intimate Partner Violence: Not on file    Review of Systems:  All systems reviewed an negative except where noted in HPI.  Gen: Denies any fever, chills, sweats, anorexia, fatigue, weakness, malaise, weight loss, and sleep disorder CV: Denies chest pain, angina, palpitations, syncope, orthopnea, PND, peripheral edema, and claudication. Resp: Denies dyspnea at rest, dyspnea with exercise, cough, sputum, wheezing, coughing up blood, and pleurisy. GI: Denies vomiting blood, jaundice, and fecal incontinence.   Denies dysphagia or odynophagia. GU : Denies urinary burning, blood in urine, urinary frequency, urinary hesitancy, nocturnal urination, and urinary incontinence. MS: Denies joint pain, limitation of movement, and swelling, stiffness, low back pain, extremity pain. Denies muscle weakness, cramps, atrophy.  Derm: Denies rash, itching, dry skin, hives, moles, warts, or unhealing ulcers.  Psych: Denies depression, anxiety, memory loss, suicidal ideation, hallucinations, paranoia, and confusion. Heme: Denies bruising, bleeding, and enlarged lymph nodes. Neuro:  Denies any headaches, dizziness, paresthesias. Endo:  Denies any problems with DM, thyroid, adrenal function.   Physical Exam: General:  Alert, well-developed, in NAD Head:  Normocephalic and atraumatic. Eyes:  Sclera clear, no icterus.   Conjunctiva pink. Ears:  Normal auditory acuity. Mouth:  No deformity or lesions.  Neck:  Supple; no masses . Lungs:  Clear throughout to auscultation.   No wheezes, crackles, or rhonchi. No acute distress. Heart:  Regular rate and rhythm; no murmurs. Abdomen:  Soft, nondistended, nontender. No masses, hepatomegaly. No obvious masses.  Normal bowel .    Rectal:  Deferred   Msk:  Symmetrical without gross deformities.. Pulses:  Normal pulses noted. Extremities:  Without edema. Neurologic:   Alert and  oriented x4;  grossly normal neurologically. Skin:  Intact without significant lesions or rashes. Cervical Nodes:  No significant cervical adenopathy. Psych:  Alert and cooperative. Normal mood and affect.   Impression / Plan:   Family history of colon polyps for colonoscopy. Upper abdominal pain for EGD. See 08/23/2020 H&P   This patient is appropriate for endoscopic procedures in the ambulatory setting.    Pricilla Riffle. Fuller Plan  10/04/2020, 8:05 AM

## 2020-10-04 NOTE — Progress Notes (Signed)
Vss nad pt arousable

## 2020-10-04 NOTE — Patient Instructions (Signed)
Handouts given for Gastritis, polyps, hemorrhoids, diverticulosis and high fiber diet.  Resume previous medications and diet.  Pick up new prescription for famotidine today.  Await pathology results.     YOU HAD AN ENDOSCOPIC PROCEDURE TODAY AT [facility]:   Refer to the procedure report that was given to you for any specific questions about what was found during the examination.  If the procedure report does not answer your questions, please call your gastroenterologist to clarify.  If you requested that your care partner not be given the details of your procedure findings, then the procedure report has been included in a sealed envelope for you to review at your convenience later.  YOU SHOULD EXPECT: Some feelings of bloating in the abdomen. Passage of more gas than usual.  Walking can help get rid of the air that was put into your GI tract during the procedure and reduce the bloating. If you had a lower endoscopy (such as a colonoscopy or flexible sigmoidoscopy) you may notice spotting of blood in your stool or on the toilet paper. If you underwent a bowel prep for your procedure, you may not have a normal bowel movement for a few days.  Please Note:  You might notice some irritation and congestion in your nose or some drainage.  This is from the oxygen used during your procedure.  There is no need for concern and it should clear up in a day or so.  SYMPTOMS TO REPORT IMMEDIATELY:  Following lower endoscopy (colonoscopy or flexible sigmoidoscopy):  Excessive amounts of blood in the stool  Significant tenderness or worsening of abdominal pains  Swelling of the abdomen that is new, acute  Fever of 100F or higher  Following upper endoscopy (EGD)  Vomiting of blood or coffee ground material  New chest pain or pain under the shoulder blades  Painful or persistently difficult swallowing  New shortness of breath  Fever of 100F or higher  Black, tarry-looking stools  For  urgent or emergent issues, a gastroenterologist can be reached at any hour by calling [phone]. Do not use MyChart messaging for urgent concerns.    DIET:  We do recommend a small meal at first, but then you may proceed to your regular diet.  Drink plenty of fluids but you should avoid alcoholic beverages for 24 hours.  ACTIVITY:  You should plan to take it easy for the rest of today and you should NOT DRIVE or use heavy machinery until tomorrow (because of the sedation medicines used during the test).    FOLLOW UP: Our staff will call the number listed on your records 48-72 hours following your procedure to check on you and address any questions or concerns that you may have regarding the information given to you following your procedure. If we do not reach you, we will leave a message.  We will attempt to reach you two times.  During this call, we will ask if you have developed any symptoms of COVID 19. If you develop any symptoms (ie: fever, flu-like symptoms, shortness of breath, cough etc.) before then, please call [phone].  If you test positive for Covid 19 in the 2 weeks post procedure, please call and report this information to [place].    If any biopsies were taken you will be contacted by phone or by letter within the next 1-3 weeks.  Please call us at [phone] if you have not heard about the biopsies in 3 weeks.  SIGNATURES/CONFIDENTIALITY: You and/or your care partner have signed paperwork which will be entered into your electronic medical record.  These signatures attest to the fact that that the information above on your After Visit Summary has been reviewed and is understood.  Full responsibility of the confidentiality of this discharge information lies with you and/or your care-partner.

## 2020-10-04 NOTE — Op Note (Signed)
[place] Patient Name: Colleen Duffy Procedure Date: 10/04/2020 8:01 AM MRN: [id number] Endoscopist: Ladene Artist , MD Age: 55 Referring MD:  Date of Birth: 06/01/1965 Gender: Female Account #: [id number] Procedure:                Upper GI endoscopy Indications:              Upper abdominal pain Medicines:                Monitored Anesthesia Care Procedure:                Pre-Anesthesia Assessment:                           - Prior to the procedure, a History and Physical                            was performed, and patient medications and                            allergies were reviewed. The patient's tolerance of                            previous anesthesia was also reviewed. The risks                            and benefits of the procedure and the sedation                            options and risks were discussed with the patient.                            All questions were answered, and informed consent                            was obtained. Prior Anticoagulants: The patient has                            taken no previous anticoagulant or antiplatelet                            agents. ASA Grade Assessment: II - A patient with                            mild systemic disease. After reviewing the risks                            and benefits, the patient was deemed in                            satisfactory condition to undergo the procedure.                           After obtaining informed consent, the endoscope was  passed under direct vision. Throughout the                            procedure, the patient's blood pressure, pulse, and                            oxygen saturations were monitored continuously. The                            GIF [id number] [id number] was introduced through the                            mouth, and advanced to the second part of duodenum.                            The upper GI endoscopy was  accomplished without                            difficulty. The patient tolerated the procedure                            well. Scope In: Scope Out: Findings:                 The examined esophagus was normal.                           Patchy moderately erythematous mucosa without                            bleeding was found in the gastric body and in the                            gastric antrum. Biopsies were taken with a cold                            forceps for histology.                           The exam of the stomach was otherwise normal.                           The duodenal bulb and second portion of the                            duodenum were normal. Complications:            No immediate complications. Estimated Blood Loss:     Estimated blood loss was minimal. Impression:               - Normal esophagus.                           - Erythematous mucosa in the gastric body and  antrum. Biopsied.                           - Normal duodenal bulb and second portion of the                            duodenum. Recommendation:           - Patient has a contact number available for                            emergencies. The signs and symptoms of potential                            delayed complications were discussed with the                            patient. Return to normal activities tomorrow.                            Written discharge instructions were provided to the                            patient.                           - Resume previous diet.                           - Continue present medications.                           - Famotidine 40 mg po qd, 1 year of refills.                           - Await pathology results.                           - Return to GI office in 6 weeks. Ladene Artist, MD 10/04/2020 8:44:00 AM This report has been signed electronically.

## 2020-10-08 ENCOUNTER — Telehealth: Payer: Self-pay

## 2020-10-08 NOTE — Telephone Encounter (Signed)
  Follow up Call-  Call back number 10/04/2020  Post procedure Call Back phone  # [phone]  Permission to leave phone message Yes  Some recent data might be hidden     Patient questions:  Do you have a fever, pain , or abdominal swelling? No. Pain Score  0 *  Have you tolerated food without any problems? Yes.    Have you been able to return to your normal activities? Yes.    Do you have any questions about your discharge instructions: Diet   No. Medications  No. Follow up visit  No.  Do you have questions or concerns about your Care? No.  Actions: * If pain score is 4 or above: No action needed, pain <4.   Have you developed a fever since your procedure? No   2.   Have you had an respiratory symptoms (SOB or cough) since your procedure? No   3.   Have you tested positive for COVID 19 since your procedure no   4.   Have you had any family members/close contacts diagnosed with the COVID 19 since your procedure?  No    If yes to any of these questions please route to Joylene John, RN and Joella Prince, RN

## 2020-10-17 ENCOUNTER — Encounter: Payer: Self-pay | Admitting: Gastroenterology

## 2020-11-19 DIAGNOSIS — N951 Menopausal and female climacteric states: Secondary | ICD-10-CM | POA: Diagnosis not present

## 2020-11-19 DIAGNOSIS — R5382 Chronic fatigue, unspecified: Secondary | ICD-10-CM | POA: Diagnosis not present

## 2020-11-21 DIAGNOSIS — R232 Flushing: Secondary | ICD-10-CM | POA: Diagnosis not present

## 2020-11-21 DIAGNOSIS — N951 Menopausal and female climacteric states: Secondary | ICD-10-CM | POA: Diagnosis not present

## 2020-11-21 DIAGNOSIS — N898 Other specified noninflammatory disorders of vagina: Secondary | ICD-10-CM | POA: Diagnosis not present

## 2020-11-21 DIAGNOSIS — R5382 Chronic fatigue, unspecified: Secondary | ICD-10-CM | POA: Diagnosis not present

## 2020-11-22 DIAGNOSIS — Z01419 Encounter for gynecological examination (general) (routine) without abnormal findings: Secondary | ICD-10-CM | POA: Diagnosis not present

## 2020-11-22 DIAGNOSIS — Z6832 Body mass index (BMI) 32.0-32.9, adult: Secondary | ICD-10-CM | POA: Diagnosis not present

## 2021-01-31 DIAGNOSIS — E611 Iron deficiency: Secondary | ICD-10-CM | POA: Diagnosis not present

## 2021-01-31 DIAGNOSIS — Z7189 Other specified counseling: Secondary | ICD-10-CM | POA: Diagnosis not present

## 2021-01-31 DIAGNOSIS — I10 Essential (primary) hypertension: Secondary | ICD-10-CM | POA: Diagnosis not present

## 2021-02-20 DIAGNOSIS — R5382 Chronic fatigue, unspecified: Secondary | ICD-10-CM | POA: Diagnosis not present

## 2021-02-20 DIAGNOSIS — N951 Menopausal and female climacteric states: Secondary | ICD-10-CM | POA: Diagnosis not present

## 2021-02-22 DIAGNOSIS — R232 Flushing: Secondary | ICD-10-CM | POA: Diagnosis not present

## 2021-02-22 DIAGNOSIS — Z6831 Body mass index (BMI) 31.0-31.9, adult: Secondary | ICD-10-CM | POA: Diagnosis not present

## 2021-02-22 DIAGNOSIS — N951 Menopausal and female climacteric states: Secondary | ICD-10-CM | POA: Diagnosis not present

## 2021-02-22 DIAGNOSIS — M2550 Pain in unspecified joint: Secondary | ICD-10-CM | POA: Diagnosis not present

## 2021-03-23 ENCOUNTER — Other Ambulatory Visit: Payer: Self-pay | Admitting: Gastroenterology

## 2021-05-07 DIAGNOSIS — J029 Acute pharyngitis, unspecified: Secondary | ICD-10-CM | POA: Diagnosis not present

## 2021-05-07 DIAGNOSIS — R051 Acute cough: Secondary | ICD-10-CM | POA: Diagnosis not present

## 2021-05-07 DIAGNOSIS — R0981 Nasal congestion: Secondary | ICD-10-CM | POA: Diagnosis not present

## 2021-05-08 DIAGNOSIS — E038 Other specified hypothyroidism: Secondary | ICD-10-CM | POA: Diagnosis not present

## 2021-05-08 DIAGNOSIS — N951 Menopausal and female climacteric states: Secondary | ICD-10-CM | POA: Diagnosis not present

## 2021-05-10 DIAGNOSIS — Z6831 Body mass index (BMI) 31.0-31.9, adult: Secondary | ICD-10-CM | POA: Diagnosis not present

## 2021-05-10 DIAGNOSIS — R232 Flushing: Secondary | ICD-10-CM | POA: Diagnosis not present

## 2021-05-10 DIAGNOSIS — N951 Menopausal and female climacteric states: Secondary | ICD-10-CM | POA: Diagnosis not present

## 2021-05-10 DIAGNOSIS — N898 Other specified noninflammatory disorders of vagina: Secondary | ICD-10-CM | POA: Diagnosis not present

## 2021-05-17 DIAGNOSIS — R300 Dysuria: Secondary | ICD-10-CM | POA: Diagnosis not present

## 2021-05-17 DIAGNOSIS — N390 Urinary tract infection, site not specified: Secondary | ICD-10-CM | POA: Diagnosis not present

## 2021-05-17 DIAGNOSIS — R030 Elevated blood-pressure reading, without diagnosis of hypertension: Secondary | ICD-10-CM | POA: Diagnosis not present

## 2021-05-22 DIAGNOSIS — I10 Essential (primary) hypertension: Secondary | ICD-10-CM | POA: Diagnosis not present

## 2021-05-29 DIAGNOSIS — I10 Essential (primary) hypertension: Secondary | ICD-10-CM | POA: Diagnosis not present

## 2021-05-29 DIAGNOSIS — Z1331 Encounter for screening for depression: Secondary | ICD-10-CM | POA: Diagnosis not present

## 2021-05-29 DIAGNOSIS — Z1339 Encounter for screening examination for other mental health and behavioral disorders: Secondary | ICD-10-CM | POA: Diagnosis not present

## 2021-05-29 DIAGNOSIS — E785 Hyperlipidemia, unspecified: Secondary | ICD-10-CM | POA: Diagnosis not present

## 2021-05-29 DIAGNOSIS — Z0000 Encounter for general adult medical examination without abnormal findings: Secondary | ICD-10-CM | POA: Diagnosis not present

## 2021-07-14 ENCOUNTER — Other Ambulatory Visit: Payer: Self-pay | Admitting: Gastroenterology

## 2021-07-17 DIAGNOSIS — E038 Other specified hypothyroidism: Secondary | ICD-10-CM | POA: Diagnosis not present

## 2021-07-17 DIAGNOSIS — N951 Menopausal and female climacteric states: Secondary | ICD-10-CM | POA: Diagnosis not present

## 2021-07-19 DIAGNOSIS — Z6831 Body mass index (BMI) 31.0-31.9, adult: Secondary | ICD-10-CM | POA: Diagnosis not present

## 2021-07-19 DIAGNOSIS — R6882 Decreased libido: Secondary | ICD-10-CM | POA: Diagnosis not present

## 2021-07-19 DIAGNOSIS — R232 Flushing: Secondary | ICD-10-CM | POA: Diagnosis not present

## 2021-07-19 DIAGNOSIS — N951 Menopausal and female climacteric states: Secondary | ICD-10-CM | POA: Diagnosis not present

## 2021-08-01 DIAGNOSIS — H722X2 Other marginal perforations of tympanic membrane, left ear: Secondary | ICD-10-CM | POA: Diagnosis not present

## 2021-08-01 DIAGNOSIS — H9012 Conductive hearing loss, unilateral, left ear, with unrestricted hearing on the contralateral side: Secondary | ICD-10-CM | POA: Diagnosis not present

## 2021-08-20 ENCOUNTER — Other Ambulatory Visit: Payer: Self-pay | Admitting: Gastroenterology

## 2021-08-20 DIAGNOSIS — R1010 Upper abdominal pain, unspecified: Secondary | ICD-10-CM

## 2021-09-17 DIAGNOSIS — N951 Menopausal and female climacteric states: Secondary | ICD-10-CM | POA: Diagnosis not present

## 2021-09-17 DIAGNOSIS — E038 Other specified hypothyroidism: Secondary | ICD-10-CM | POA: Diagnosis not present

## 2021-09-19 DIAGNOSIS — R232 Flushing: Secondary | ICD-10-CM | POA: Diagnosis not present

## 2021-09-19 DIAGNOSIS — N951 Menopausal and female climacteric states: Secondary | ICD-10-CM | POA: Diagnosis not present

## 2021-09-19 DIAGNOSIS — N898 Other specified noninflammatory disorders of vagina: Secondary | ICD-10-CM | POA: Diagnosis not present

## 2021-09-19 DIAGNOSIS — Z6831 Body mass index (BMI) 31.0-31.9, adult: Secondary | ICD-10-CM | POA: Diagnosis not present

## 2021-09-24 ENCOUNTER — Other Ambulatory Visit: Payer: Self-pay | Admitting: Gastroenterology

## 2021-10-07 DIAGNOSIS — Z1231 Encounter for screening mammogram for malignant neoplasm of breast: Secondary | ICD-10-CM | POA: Diagnosis not present

## 2021-10-31 ENCOUNTER — Other Ambulatory Visit: Payer: Self-pay | Admitting: Gastroenterology

## 2021-11-13 DIAGNOSIS — E038 Other specified hypothyroidism: Secondary | ICD-10-CM | POA: Diagnosis not present

## 2021-11-13 DIAGNOSIS — N951 Menopausal and female climacteric states: Secondary | ICD-10-CM | POA: Diagnosis not present

## 2021-11-15 DIAGNOSIS — R5382 Chronic fatigue, unspecified: Secondary | ICD-10-CM | POA: Diagnosis not present

## 2021-11-15 DIAGNOSIS — Z79890 Hormone replacement therapy (postmenopausal): Secondary | ICD-10-CM | POA: Diagnosis not present

## 2021-11-15 DIAGNOSIS — R232 Flushing: Secondary | ICD-10-CM | POA: Diagnosis not present

## 2021-11-15 DIAGNOSIS — N951 Menopausal and female climacteric states: Secondary | ICD-10-CM | POA: Diagnosis not present

## 2021-11-30 ENCOUNTER — Other Ambulatory Visit: Payer: Self-pay | Admitting: Gastroenterology

## 2021-12-04 ENCOUNTER — Other Ambulatory Visit: Payer: Self-pay | Admitting: Gastroenterology

## 2021-12-04 DIAGNOSIS — R1010 Upper abdominal pain, unspecified: Secondary | ICD-10-CM

## 2021-12-19 DIAGNOSIS — R300 Dysuria: Secondary | ICD-10-CM | POA: Diagnosis not present

## 2021-12-19 DIAGNOSIS — E611 Iron deficiency: Secondary | ICD-10-CM | POA: Diagnosis not present

## 2021-12-24 DIAGNOSIS — Z124 Encounter for screening for malignant neoplasm of cervix: Secondary | ICD-10-CM | POA: Diagnosis not present

## 2021-12-24 DIAGNOSIS — Z01419 Encounter for gynecological examination (general) (routine) without abnormal findings: Secondary | ICD-10-CM | POA: Diagnosis not present

## 2021-12-24 DIAGNOSIS — Z6833 Body mass index (BMI) 33.0-33.9, adult: Secondary | ICD-10-CM | POA: Diagnosis not present

## 2022-01-01 ENCOUNTER — Other Ambulatory Visit: Payer: Self-pay | Admitting: Gastroenterology

## 2022-01-16 ENCOUNTER — Other Ambulatory Visit: Payer: Self-pay | Admitting: Gastroenterology

## 2022-01-23 DIAGNOSIS — N951 Menopausal and female climacteric states: Secondary | ICD-10-CM | POA: Diagnosis not present

## 2022-01-23 DIAGNOSIS — E038 Other specified hypothyroidism: Secondary | ICD-10-CM | POA: Diagnosis not present

## 2022-01-27 DIAGNOSIS — Z6831 Body mass index (BMI) 31.0-31.9, adult: Secondary | ICD-10-CM | POA: Diagnosis not present

## 2022-01-27 DIAGNOSIS — R5382 Chronic fatigue, unspecified: Secondary | ICD-10-CM | POA: Diagnosis not present

## 2022-01-27 DIAGNOSIS — R6882 Decreased libido: Secondary | ICD-10-CM | POA: Diagnosis not present

## 2022-01-27 DIAGNOSIS — Z79890 Hormone replacement therapy (postmenopausal): Secondary | ICD-10-CM | POA: Diagnosis not present

## 2022-01-30 DIAGNOSIS — N888 Other specified noninflammatory disorders of cervix uteri: Secondary | ICD-10-CM | POA: Diagnosis not present

## 2022-01-30 DIAGNOSIS — R87610 Atypical squamous cells of undetermined significance on cytologic smear of cervix (ASC-US): Secondary | ICD-10-CM | POA: Diagnosis not present

## 2022-02-13 DIAGNOSIS — R102 Pelvic and perineal pain: Secondary | ICD-10-CM | POA: Diagnosis not present

## 2022-02-24 DIAGNOSIS — M25362 Other instability, left knee: Secondary | ICD-10-CM | POA: Diagnosis not present

## 2022-02-24 DIAGNOSIS — M25562 Pain in left knee: Secondary | ICD-10-CM | POA: Diagnosis not present

## 2022-02-24 DIAGNOSIS — M2392 Unspecified internal derangement of left knee: Secondary | ICD-10-CM | POA: Diagnosis not present

## 2022-03-02 ENCOUNTER — Other Ambulatory Visit: Payer: Self-pay | Admitting: Gastroenterology

## 2022-03-02 DIAGNOSIS — R1010 Upper abdominal pain, unspecified: Secondary | ICD-10-CM

## 2022-03-04 DIAGNOSIS — M25362 Other instability, left knee: Secondary | ICD-10-CM | POA: Diagnosis not present

## 2022-03-10 DIAGNOSIS — M25562 Pain in left knee: Secondary | ICD-10-CM | POA: Diagnosis not present

## 2022-03-10 DIAGNOSIS — S83242A Other tear of medial meniscus, current injury, left knee, initial encounter: Secondary | ICD-10-CM | POA: Diagnosis not present

## 2022-03-10 DIAGNOSIS — M1712 Unilateral primary osteoarthritis, left knee: Secondary | ICD-10-CM | POA: Diagnosis not present

## 2022-04-07 DIAGNOSIS — N951 Menopausal and female climacteric states: Secondary | ICD-10-CM | POA: Diagnosis not present

## 2022-04-07 DIAGNOSIS — E038 Other specified hypothyroidism: Secondary | ICD-10-CM | POA: Diagnosis not present

## 2022-04-09 DIAGNOSIS — N951 Menopausal and female climacteric states: Secondary | ICD-10-CM | POA: Diagnosis not present

## 2022-04-09 DIAGNOSIS — R232 Flushing: Secondary | ICD-10-CM | POA: Diagnosis not present

## 2022-04-09 DIAGNOSIS — M2550 Pain in unspecified joint: Secondary | ICD-10-CM | POA: Diagnosis not present

## 2022-04-09 DIAGNOSIS — Z79890 Hormone replacement therapy (postmenopausal): Secondary | ICD-10-CM | POA: Diagnosis not present

## 2022-04-30 DIAGNOSIS — E785 Hyperlipidemia, unspecified: Secondary | ICD-10-CM | POA: Diagnosis not present

## 2022-04-30 DIAGNOSIS — I10 Essential (primary) hypertension: Secondary | ICD-10-CM | POA: Diagnosis not present

## 2022-04-30 DIAGNOSIS — E669 Obesity, unspecified: Secondary | ICD-10-CM | POA: Diagnosis not present

## 2022-04-30 DIAGNOSIS — R739 Hyperglycemia, unspecified: Secondary | ICD-10-CM | POA: Diagnosis not present

## 2022-06-12 DIAGNOSIS — E611 Iron deficiency: Secondary | ICD-10-CM | POA: Diagnosis not present

## 2022-06-13 DIAGNOSIS — F419 Anxiety disorder, unspecified: Secondary | ICD-10-CM | POA: Diagnosis not present

## 2022-06-13 DIAGNOSIS — E611 Iron deficiency: Secondary | ICD-10-CM | POA: Diagnosis not present

## 2022-06-13 DIAGNOSIS — I10 Essential (primary) hypertension: Secondary | ICD-10-CM | POA: Diagnosis not present

## 2022-06-13 DIAGNOSIS — R7989 Other specified abnormal findings of blood chemistry: Secondary | ICD-10-CM | POA: Diagnosis not present

## 2022-06-13 DIAGNOSIS — E785 Hyperlipidemia, unspecified: Secondary | ICD-10-CM | POA: Diagnosis not present

## 2022-06-19 DIAGNOSIS — Z1339 Encounter for screening examination for other mental health and behavioral disorders: Secondary | ICD-10-CM | POA: Diagnosis not present

## 2022-06-19 DIAGNOSIS — F419 Anxiety disorder, unspecified: Secondary | ICD-10-CM | POA: Diagnosis not present

## 2022-06-19 DIAGNOSIS — H7292 Unspecified perforation of tympanic membrane, left ear: Secondary | ICD-10-CM | POA: Diagnosis not present

## 2022-06-19 DIAGNOSIS — I10 Essential (primary) hypertension: Secondary | ICD-10-CM | POA: Diagnosis not present

## 2022-06-19 DIAGNOSIS — R7301 Impaired fasting glucose: Secondary | ICD-10-CM | POA: Diagnosis not present

## 2022-06-19 DIAGNOSIS — Z0000 Encounter for general adult medical examination without abnormal findings: Secondary | ICD-10-CM | POA: Diagnosis not present

## 2022-06-19 DIAGNOSIS — R82998 Other abnormal findings in urine: Secondary | ICD-10-CM | POA: Diagnosis not present

## 2022-06-19 DIAGNOSIS — E669 Obesity, unspecified: Secondary | ICD-10-CM | POA: Diagnosis not present

## 2022-06-19 DIAGNOSIS — E785 Hyperlipidemia, unspecified: Secondary | ICD-10-CM | POA: Diagnosis not present

## 2022-06-19 DIAGNOSIS — Z1331 Encounter for screening for depression: Secondary | ICD-10-CM | POA: Diagnosis not present

## 2022-06-24 DIAGNOSIS — N951 Menopausal and female climacteric states: Secondary | ICD-10-CM | POA: Diagnosis not present

## 2022-06-24 DIAGNOSIS — E038 Other specified hypothyroidism: Secondary | ICD-10-CM | POA: Diagnosis not present

## 2022-06-27 DIAGNOSIS — N951 Menopausal and female climacteric states: Secondary | ICD-10-CM | POA: Diagnosis not present

## 2022-06-27 DIAGNOSIS — R5382 Chronic fatigue, unspecified: Secondary | ICD-10-CM | POA: Diagnosis not present

## 2022-06-27 DIAGNOSIS — R232 Flushing: Secondary | ICD-10-CM | POA: Diagnosis not present

## 2022-06-27 DIAGNOSIS — E038 Other specified hypothyroidism: Secondary | ICD-10-CM | POA: Diagnosis not present

## 2022-08-07 ENCOUNTER — Other Ambulatory Visit (HOSPITAL_BASED_OUTPATIENT_CLINIC_OR_DEPARTMENT_OTHER): Payer: Self-pay

## 2022-08-07 DIAGNOSIS — I10 Essential (primary) hypertension: Secondary | ICD-10-CM | POA: Diagnosis not present

## 2022-08-07 MED ORDER — ZEPBOUND 5 MG/0.5ML ~~LOC~~ SOAJ: SUBCUTANEOUS | 0 refills | Status: AC

## 2022-08-07 MED ORDER — ZEPBOUND 2.5 MG/0.5ML ~~LOC~~ SOAJ
SUBCUTANEOUS | 0 refills | Status: AC
  Filled 2022-08-07: qty 2, 28d supply, fill #0

## 2022-08-16 ENCOUNTER — Other Ambulatory Visit (HOSPITAL_BASED_OUTPATIENT_CLINIC_OR_DEPARTMENT_OTHER): Payer: Self-pay

## 2022-08-18 ENCOUNTER — Other Ambulatory Visit (HOSPITAL_BASED_OUTPATIENT_CLINIC_OR_DEPARTMENT_OTHER): Payer: Self-pay

## 2022-09-24 DIAGNOSIS — E038 Other specified hypothyroidism: Secondary | ICD-10-CM | POA: Diagnosis not present

## 2022-09-29 DIAGNOSIS — R232 Flushing: Secondary | ICD-10-CM | POA: Diagnosis not present

## 2022-09-29 DIAGNOSIS — N951 Menopausal and female climacteric states: Secondary | ICD-10-CM | POA: Diagnosis not present

## 2022-09-29 DIAGNOSIS — Z6829 Body mass index (BMI) 29.0-29.9, adult: Secondary | ICD-10-CM | POA: Diagnosis not present

## 2022-09-29 DIAGNOSIS — R6882 Decreased libido: Secondary | ICD-10-CM | POA: Diagnosis not present

## 2022-10-23 ENCOUNTER — Other Ambulatory Visit (HOSPITAL_BASED_OUTPATIENT_CLINIC_OR_DEPARTMENT_OTHER): Payer: Self-pay

## 2022-10-23 MED ORDER — ALPRAZOLAM 0.5 MG PO TABS
0.2500 mg | ORAL_TABLET | Freq: Two times a day (BID) | ORAL | 0 refills | Status: DC | PRN
  Filled 2022-10-23: qty 45, 45d supply, fill #0

## 2022-11-03 ENCOUNTER — Other Ambulatory Visit (HOSPITAL_BASED_OUTPATIENT_CLINIC_OR_DEPARTMENT_OTHER): Payer: Self-pay

## 2022-11-05 ENCOUNTER — Other Ambulatory Visit (HOSPITAL_BASED_OUTPATIENT_CLINIC_OR_DEPARTMENT_OTHER): Payer: Self-pay

## 2022-11-20 DIAGNOSIS — M1712 Unilateral primary osteoarthritis, left knee: Secondary | ICD-10-CM | POA: Diagnosis not present

## 2022-12-15 DIAGNOSIS — E038 Other specified hypothyroidism: Secondary | ICD-10-CM | POA: Diagnosis not present

## 2022-12-15 DIAGNOSIS — N951 Menopausal and female climacteric states: Secondary | ICD-10-CM | POA: Diagnosis not present

## 2022-12-17 DIAGNOSIS — N951 Menopausal and female climacteric states: Secondary | ICD-10-CM | POA: Diagnosis not present

## 2022-12-17 DIAGNOSIS — R5382 Chronic fatigue, unspecified: Secondary | ICD-10-CM | POA: Diagnosis not present

## 2022-12-17 DIAGNOSIS — R6882 Decreased libido: Secondary | ICD-10-CM | POA: Diagnosis not present

## 2022-12-17 DIAGNOSIS — E038 Other specified hypothyroidism: Secondary | ICD-10-CM | POA: Diagnosis not present

## 2022-12-30 DIAGNOSIS — E039 Hypothyroidism, unspecified: Secondary | ICD-10-CM | POA: Diagnosis not present

## 2022-12-30 DIAGNOSIS — Z1331 Encounter for screening for depression: Secondary | ICD-10-CM | POA: Diagnosis not present

## 2022-12-30 DIAGNOSIS — N951 Menopausal and female climacteric states: Secondary | ICD-10-CM | POA: Diagnosis not present

## 2022-12-30 DIAGNOSIS — M2550 Pain in unspecified joint: Secondary | ICD-10-CM | POA: Diagnosis not present

## 2022-12-30 DIAGNOSIS — E669 Obesity, unspecified: Secondary | ICD-10-CM | POA: Diagnosis not present

## 2022-12-30 DIAGNOSIS — R5382 Chronic fatigue, unspecified: Secondary | ICD-10-CM | POA: Diagnosis not present

## 2023-01-01 DIAGNOSIS — M2550 Pain in unspecified joint: Secondary | ICD-10-CM | POA: Diagnosis not present

## 2023-01-01 DIAGNOSIS — E039 Hypothyroidism, unspecified: Secondary | ICD-10-CM | POA: Diagnosis not present

## 2023-01-01 DIAGNOSIS — N951 Menopausal and female climacteric states: Secondary | ICD-10-CM | POA: Diagnosis not present

## 2023-01-01 DIAGNOSIS — N898 Other specified noninflammatory disorders of vagina: Secondary | ICD-10-CM | POA: Diagnosis not present

## 2023-01-08 DIAGNOSIS — R5382 Chronic fatigue, unspecified: Secondary | ICD-10-CM | POA: Diagnosis not present

## 2023-01-08 DIAGNOSIS — Z6830 Body mass index (BMI) 30.0-30.9, adult: Secondary | ICD-10-CM | POA: Diagnosis not present

## 2023-01-08 DIAGNOSIS — E039 Hypothyroidism, unspecified: Secondary | ICD-10-CM | POA: Diagnosis not present

## 2023-01-08 DIAGNOSIS — E782 Mixed hyperlipidemia: Secondary | ICD-10-CM | POA: Diagnosis not present

## 2023-01-08 DIAGNOSIS — R232 Flushing: Secondary | ICD-10-CM | POA: Diagnosis not present

## 2023-01-20 DIAGNOSIS — E611 Iron deficiency: Secondary | ICD-10-CM | POA: Diagnosis not present

## 2023-01-20 DIAGNOSIS — R7301 Impaired fasting glucose: Secondary | ICD-10-CM | POA: Diagnosis not present

## 2023-02-06 ENCOUNTER — Other Ambulatory Visit (HOSPITAL_BASED_OUTPATIENT_CLINIC_OR_DEPARTMENT_OTHER): Payer: Self-pay

## 2023-02-06 MED ORDER — ALPRAZOLAM 0.5 MG PO TABS
0.5000 mg | ORAL_TABLET | Freq: Two times a day (BID) | ORAL | 0 refills | Status: DC | PRN
  Filled 2023-02-06: qty 90, 90d supply, fill #0

## 2023-02-10 ENCOUNTER — Other Ambulatory Visit (HOSPITAL_BASED_OUTPATIENT_CLINIC_OR_DEPARTMENT_OTHER): Payer: Self-pay

## 2023-02-10 ENCOUNTER — Other Ambulatory Visit: Payer: Self-pay
